# Patient Record
Sex: Male | Born: 1990 | Race: Black or African American | Hispanic: No | Marital: Single | Smoking: Never smoker
Health system: Southern US, Community
[De-identification: ages and names within clinical notes are randomized; demographics above are authoritative.]

---

## 2017-04-11 ENCOUNTER — Emergency Department
Admission: EM | Admit: 2017-04-11 | Discharge: 2017-04-11 | Disposition: A | Discharge status: Home / Self Care | Payer: Self-pay | Attending: Emergency Medicine | Admitting: Emergency Medicine

## 2017-04-11 ENCOUNTER — Encounter: Payer: Self-pay | Admitting: Emergency Medicine

## 2017-04-11 DIAGNOSIS — E876 Hypokalemia: Secondary | ICD-10-CM | POA: Insufficient documentation

## 2017-04-11 DIAGNOSIS — R197 Diarrhea, unspecified: Secondary | ICD-10-CM | POA: Insufficient documentation

## 2017-04-11 DIAGNOSIS — A071 Giardiasis [lambliasis]: Secondary | ICD-10-CM | POA: Insufficient documentation

## 2017-04-11 LAB — URINALYSIS, COMPLETE (UACMP) WITH MICROSCOPIC
BACTERIA UA: NONE SEEN
Bilirubin Urine: NEGATIVE
Glucose, UA: NEGATIVE mg/dL
Hgb urine dipstick: NEGATIVE
Ketones, ur: NEGATIVE mg/dL
Leukocytes, UA: NEGATIVE
Nitrite: NEGATIVE
Protein, ur: NEGATIVE mg/dL
SPECIFIC GRAVITY, URINE: 1.024 (ref 1.005–1.030)
SQUAMOUS EPITHELIAL / LPF: NONE SEEN
pH: 5 (ref 5.0–8.0)

## 2017-04-11 LAB — GASTROINTESTINAL PANEL BY PCR, STOOL (REPLACES STOOL CULTURE)
ASTROVIRUS: NOT DETECTED
Adenovirus F40/41: NOT DETECTED
CAMPYLOBACTER SPECIES: NOT DETECTED
Cryptosporidium: NOT DETECTED
Cyclospora cayetanensis: NOT DETECTED
ENTEROTOXIGENIC E COLI (ETEC): NOT DETECTED
Entamoeba histolytica: NOT DETECTED
Enteroaggregative E coli (EAEC): NOT DETECTED
Enteropathogenic E coli (EPEC): NOT DETECTED
Giardia lamblia: DETECTED — AB
NOROVIRUS GI/GII: NOT DETECTED
PLESIMONAS SHIGELLOIDES: NOT DETECTED
ROTAVIRUS A: NOT DETECTED
SAPOVIRUS (I, II, IV, AND V): NOT DETECTED
SHIGA LIKE TOXIN PRODUCING E COLI (STEC): NOT DETECTED
Salmonella species: NOT DETECTED
Shigella/Enteroinvasive E coli (EIEC): NOT DETECTED
VIBRIO CHOLERAE: NOT DETECTED
Vibrio species: NOT DETECTED
Yersinia enterocolitica: NOT DETECTED

## 2017-04-11 LAB — C DIFFICILE QUICK SCREEN W PCR REFLEX
C DIFFICILE (CDIFF) INTERP: NOT DETECTED
C DIFFICILE (CDIFF) TOXIN: NEGATIVE
C DIFFICLE (CDIFF) ANTIGEN: NEGATIVE

## 2017-04-11 LAB — CBC
HEMATOCRIT: 43.6 % (ref 40.0–52.0)
Hemoglobin: 14.3 g/dL (ref 13.0–18.0)
MCH: 27.3 pg (ref 26.0–34.0)
MCHC: 32.7 g/dL (ref 32.0–36.0)
MCV: 83.4 fL (ref 80.0–100.0)
Platelets: 248 10*3/uL (ref 150–440)
RBC: 5.23 MIL/uL (ref 4.40–5.90)
RDW: 13.9 % (ref 11.5–14.5)
WBC: 6.7 10*3/uL (ref 3.8–10.6)

## 2017-04-11 LAB — COMPREHENSIVE METABOLIC PANEL
ALBUMIN: 4.1 g/dL (ref 3.5–5.0)
ALT: 51 U/L (ref 17–63)
AST: 52 U/L — AB (ref 15–41)
Alkaline Phosphatase: 97 U/L (ref 38–126)
Anion gap: 10 (ref 5–15)
BILIRUBIN TOTAL: 0.8 mg/dL (ref 0.3–1.2)
BUN: 17 mg/dL (ref 6–20)
CHLORIDE: 105 mmol/L (ref 101–111)
CO2: 21 mmol/L — ABNORMAL LOW (ref 22–32)
CREATININE: 0.88 mg/dL (ref 0.61–1.24)
Calcium: 8.8 mg/dL — ABNORMAL LOW (ref 8.9–10.3)
GFR calc Af Amer: >60 mL/min (ref >60)
GFR calc non Af Amer: >60 mL/min (ref >60)
GLUCOSE: 78 mg/dL (ref 65–99)
POTASSIUM: 3.3 mmol/L — AB (ref 3.5–5.1)
Sodium: 136 mmol/L (ref 135–145)
TOTAL PROTEIN: 7.5 g/dL (ref 6.5–8.1)

## 2017-04-11 LAB — LIPASE, BLOOD: Lipase: 37 U/L (ref 11–51)

## 2017-04-11 MED ORDER — METRONIDAZOLE 500 MG PO TABS
ORAL_TABLET | ORAL | Status: AC
  Administered 2017-04-11: 500 mg via ORAL
  Filled 2017-04-11: qty 1

## 2017-04-11 MED ORDER — LOPERAMIDE HCL 2 MG PO TABS: 2.0000 mg | ORAL_TABLET | Freq: Four times a day (QID) | ORAL | 0 refills | Status: AC | PRN

## 2017-04-11 MED ORDER — POTASSIUM CHLORIDE CRYS ER 20 MEQ PO TBCR
40.0000 meq | EXTENDED_RELEASE_TABLET | Freq: Once | ORAL | Status: AC
  Administered 2017-04-11: 40 meq via ORAL
  Filled 2017-04-11: qty 2

## 2017-04-11 MED ORDER — METRONIDAZOLE 500 MG PO TABS
500.0000 mg | ORAL_TABLET | Freq: Once | ORAL | Status: AC
  Administered 2017-04-11: 500 mg via ORAL

## 2017-04-11 NOTE — ED Notes (Signed)
Patient unable to provide specimen at this time.  Patient reports he will alert RN when he feels that he is able to provide specimen

## 2017-04-11 NOTE — ED Notes (Signed)
Patient c/o loose/watery diarrhea twice/daily X 1 week. Patient denies abdominal pain, rectal pain, nausea, fevers.

## 2017-04-11 NOTE — ED Provider Notes (Addendum)
[facility] Emergency Department Provider Note  ____________________________________________  Time seen: Approximately 8:44 PM  I have reviewed the triage vital signs and the nursing notes.   HISTORY  Chief Complaint Diarrhea and Encopresis    HPI Richard Blevins is a 26 y.o. male, otherwise healthy, presenting with diarrhea for 1 week.  The patient reports that he has been experiencing painless, loose, watery, nonbloody stool twice daily for the past week.  Usually, this occurs immediately when he arrives at work and right before going to bed.  There is no associated abdominal pain, nausea or vomiting, fever or chills.  He has not been camping traveling as a denies states or had any sick contacts.  Patient had 2 episodes where he woke up and had soiled himself.   History reviewed. No pertinent past medical history.  There are no active problems to display for this patient.   History reviewed. No pertinent surgical history.    Allergies Patient has no known allergies.  No family history on file.  Social History Social History   Tobacco Use  . Smoking status: Never Smoker  . Smokeless tobacco: Never Used  Substance Use Topics  . Alcohol use: No    Frequency: Never  . Drug use: Not on file    Review of Systems Constitutional: No fever/chills.  Or syncope.  Eyes: No visual changes. ENT: No sore throat. No congestion or rhinorrhea. Cardiovascular: Denies chest pain. Denies palpitations. Respiratory: Denies shortness of breath.  No cough. Gastrointestinal: No abdominal pain.  No nausea, no vomiting.  Positive diarrhea; + encoparesis.  No constipation. Genitourinary: Negative for dysuria. Musculoskeletal: Negative for back pain. Skin: Negative for rash. Neurological: Negative for headaches. No focal numbness, tingling or weakness.     ____________________________________________   PHYSICAL EXAM:  VITAL SIGNS: ED Triage Vitals  Enc Vitals  Group     BP 04/11/17 1833 130/80     Pulse Rate 04/11/17 1833 76     Resp 04/11/17 1833 18     Temp 04/11/17 1833 98.6 F (37 C)     Temp Source 04/11/17 1833 Oral     SpO2 04/11/17 1833 100 %     Weight 04/11/17 1834 115 lb (52.2 kg)     Height 04/11/17 1834 5\' 8"  (1.727 m)     Head Circumference --      Peak Flow --      Pain Score --      Pain Loc --      Pain Edu? --      Excl. in GC? --     Constitutional: Alert and oriented. Well appearing and in no acute distress. Answers questions appropriately. Eyes: Conjunctivae are normal.  EOMI. No scleral icterus. Head: Atraumatic. Nose: No congestion/rhinnorhea. Mouth/Throat: Mucous membranes are moist.  Neck: No stridor.  Supple.   Cardiovascular: Normal rate, regular rhythm. No murmurs, rubs or gallops.  Respiratory: Normal respiratory effort.  No accessory muscle use or retractions. Lungs CTAB.  No wheezes, rales or ronchi. Gastrointestinal: Soft, nontender and nondistended.  No guarding or rebound.  No peritoneal signs. Genitourinary: No evidence of external hemorrhoids or palpable internal hemorrhoids.  The patient has normal rectal tone and brown stool. Musculoskeletal: No LE edema. Neurologic:  A&Ox3.  Speech is clear.  Face and smile are symmetric.  EOMI.  Moves all extremities well. Skin:  Skin is warm, dry and intact. No rash noted. Psychiatric: Mood and affect are normal. Speech and behavior are normal.  Normal judgement.  ____________________________________________   LABS (all labs ordered are listed, but only abnormal results are displayed)  Labs Reviewed  COMPREHENSIVE METABOLIC PANEL - Abnormal; Notable for the following components:      Result Value   Potassium 3.3 (*)    CO2 21 (*)    Calcium 8.8 (*)    AST 52 (*)    All other components within normal limits  URINALYSIS, COMPLETE (UACMP) WITH MICROSCOPIC - Abnormal; Notable for the following components:   Color, Urine YELLOW (*)    APPearance CLEAR (*)     All other components within normal limits  C DIFFICILE QUICK SCREEN W PCR REFLEX  GASTROINTESTINAL PANEL BY PCR, STOOL (REPLACES STOOL CULTURE)  LIPASE, BLOOD  CBC   ____________________________________________  EKG  Not indicated ____________________________________________  RADIOLOGY  No results found.  ____________________________________________   PROCEDURES  Procedure(s) performed: None  Procedures  Critical Care performed: No ____________________________________________   INITIAL IMPRESSION / ASSESSMENT AND PLAN / ED COURSE  Pertinent labs & imaging results that were available during my care of the patient were reviewed by me and considered in my medical decision making (see chart for details).  26 y.o. male, otherwise healthy, with 1 week of twice daily loose stool, and 2 episodes of encopresis last night.  Overall, the patient is hemodynamically stable and afebrile.  His abdominal examination is reassuring without any evidence of acute intra-abdominal surgical or infectious pathology.  His laboratory studies show some mild hypokalemia, which I will supplement.  We will get stool samples to evaluate for C. difficile or other bacterial infection.  Plan reevaluation for final disposition.  ____________________________________________  FINAL CLINICAL IMPRESSION(S) / ED DIAGNOSES  Final diagnoses:  Hypokalemia  Diarrhea, unspecified type         NEW MEDICATIONS STARTED DURING THIS VISIT:  This SmartLink is deprecated. Use AVSMEDLIST instead to display the medication list for a patient.    Rockne MenghiniNorman, Anne-Caroline, MD 04/11/17 [id number]    Rockne MenghiniNorman, Anne-Caroline, MD 04/11/17 2229

## 2017-04-11 NOTE — ED Triage Notes (Signed)
Patient presents to the ED with diarrhea x 1 week.  Patient states, "everything I eat or drink goes right through me."  Patient reports 2 episodes of bowel incontinence last night.  Patient denies history of the same.  Patient denies abdominal pain and denies vomiting.  Patient is in no obvious distress at this time.

## 2017-04-11 NOTE — Discharge Instructions (Addendum)
Please take a bland diet until your diarrhea has resolved.  Please make an appointment with a primary care physician for re-evaluation, and to have your potassium rechecked.  Return to the emergency department for severe pain, lightheadedness or fainting, fever, or any other symptoms concerning to you.  Take the flagyl 1 pill 2 x a day till gone.

## 2017-09-10 ENCOUNTER — Emergency Department
Admission: EM | Admit: 2017-09-10 | Discharge: 2017-09-10 | Disposition: A | Discharge status: Home / Self Care | Payer: Self-pay | Attending: Student in an Organized Health Care Education/Training Program | Admitting: Student in an Organized Health Care Education/Training Program

## 2017-09-10 ENCOUNTER — Other Ambulatory Visit: Payer: Self-pay

## 2017-09-10 ENCOUNTER — Encounter: Payer: Self-pay | Admitting: Emergency Medicine

## 2017-09-10 DIAGNOSIS — L84 Corns and callosities: Secondary | ICD-10-CM | POA: Insufficient documentation

## 2017-09-10 NOTE — ED Notes (Signed)
Callous under great toe right foot X 3 weeks. Aggravated with shoe on. Pt alert and oriented X4, active, cooperative, pt in NAD. RR even and unlabored, color WNL.

## 2017-09-10 NOTE — ED Notes (Signed)
Pt alert and oriented X4, active, cooperative, pt in NAD. RR even and unlabored, color WNL.  Pt informed to return if any life threatening symptoms occur.  Discharge and followup instructions reviewed. Ambulates safely. 

## 2017-09-10 NOTE — ED Triage Notes (Signed)
Pt arrives ambulatory to triage with c/o "callus on the bottom of his right foot". Pt reports pain when he walks. Pt has area to right great toes which has been irritated by his shoes. Pt is in NAD.

## 2017-09-10 NOTE — ED Provider Notes (Signed)
[facility] Emergency Department Provider Note    First MD Initiated Contact with Patient 09/10/17 [phone]     (approximate)  I have reviewed the triage vital signs and the nursing notes.   HISTORY  Chief Complaint Foot Pain    HPI Richard Blevins is a 27 y.o. male with right great toe pain and hard skin.  Patient works in [employer] is on his feet a lot.  No fevers.  No trauma.  Has been trying over-the-counter Dr. Margart SicklesScholl's callus remover without any improvement.  History reviewed. No pertinent past medical history. No family history on file. History reviewed. No pertinent surgical history. There are no active problems to display for this patient.     Prior to Admission medications   Medication Sig Start Date End Date Taking? Authorizing Provider  loperamide (IMODIUM A-D) 2 MG tablet Take 1 tablet (2 mg total) 4 (four) times daily as needed by mouth for diarrhea or loose stools. 04/11/17   Rockne MenghiniNorman, Anne-Caroline, MD    Allergies Patient has no known allergies.    Social History Social History   Tobacco Use  . Smoking status: Never Smoker  . Smokeless tobacco: Never Used  Substance Use Topics  . Alcohol use: No    Frequency: Never  . Drug use: Never    Review of Systems Patient denies headaches, rhinorrhea, blurry vision, numbness, shortness of breath, chest pain, edema, cough, abdominal pain, nausea, vomiting, diarrhea, dysuria, fevers, rashes or hallucinations unless otherwise stated above in HPI. ____________________________________________   PHYSICAL EXAM:  VITAL SIGNS: Vitals:   09/10/17 0107  BP: 120/76  Pulse: 69  Resp: 18  Temp: 98 F (36.7 C)  SpO2: 98%    Constitutional: Alert and oriented. Well appearing and in no acute distress. Eyes: Conjunctivae are normal.  Head: Atraumatic. Nose: No congestion/rhinnorhea. Mouth/Throat: Mucous membranes are moist.   Neck: Painless ROM.  Cardiovascular:   Good peripheral  circulation. Respiratory: Normal respiratory effort.  No retractions.  Gastrointestinal: Soft and nontender.  Musculoskeletal: No deformity noted patient does have a large callus to right great toe with probably some component of underlying blister but no purulence no drainage.  No joint effusions. Neurologic:  Normal speech and language. No gross focal neurologic deficits are appreciated.  Skin:  Skin is warm, dry and intact. No rash noted. Psychiatric: Mood and affect are normal. Speech and behavior are normal.  ____________________________________________   LABS (all labs ordered are listed, but only abnormal results are displayed)  No results found for this or any previous visit (from the past 24 hour(s)). ____________________________________________ ____________________________________________  RADIOLOGY   ____________________________________________   PROCEDURES  Procedure(s) performed:  Procedures    Critical Care performed: no ____________________________________________   INITIAL IMPRESSION / ASSESSMENT AND PLAN / ED COURSE  Pertinent labs & imaging results that were available during my care of the patient were reviewed by me and considered in my medical decision making (see chart for details).  DDX: Callus, blister, boil, fracture  Richard Blevins is a 27 y.o. who presents to the ED with symptoms as described above.  Patient in no acute distress.  No evidence of fracture.  Pain likely coming from callus.  Explained conservative management and signs and symptoms for which he should seek medical care.  Will give referral to podiatry.      ____________________________________________   FINAL CLINICAL IMPRESSION(S) / ED DIAGNOSES  Final diagnoses:  Callus of foot      NEW MEDICATIONS STARTED DURING THIS  VISIT:  New Prescriptions   No medications on file     Note:  This document was prepared using Dragon voice recognition software and may include  unintentional dictation errors.     Willy Eddy, MD 09/10/17 [phone]

## 2019-08-05 ENCOUNTER — Emergency Department (HOSPITAL_COMMUNITY)
Admission: EM | Admit: 2019-08-05 | Discharge: 2019-08-05 | Disposition: A | Discharge status: Home / Self Care | Payer: Self-pay | Attending: Emergency Medicine | Admitting: Emergency Medicine

## 2019-08-05 ENCOUNTER — Emergency Department (HOSPITAL_COMMUNITY): Payer: Self-pay

## 2019-08-05 ENCOUNTER — Other Ambulatory Visit: Payer: Self-pay

## 2019-08-05 ENCOUNTER — Encounter (HOSPITAL_COMMUNITY): Payer: Self-pay

## 2019-08-05 DIAGNOSIS — N451 Epididymitis: Secondary | ICD-10-CM

## 2019-08-05 DIAGNOSIS — N50819 Testicular pain, unspecified: Secondary | ICD-10-CM

## 2019-08-05 DIAGNOSIS — Z79899 Other long term (current) drug therapy: Secondary | ICD-10-CM | POA: Insufficient documentation

## 2019-08-05 LAB — URINALYSIS, ROUTINE W REFLEX MICROSCOPIC
Bacteria, UA: NONE SEEN
Bilirubin Urine: NEGATIVE
Glucose, UA: NEGATIVE mg/dL
Ketones, ur: NEGATIVE mg/dL
Leukocytes,Ua: NEGATIVE
Nitrite: NEGATIVE
Protein, ur: NEGATIVE mg/dL
Specific Gravity, Urine: 1.025 (ref 1.005–1.030)
pH: 6 (ref 5.0–8.0)

## 2019-08-05 LAB — HIV ANTIBODY (ROUTINE TESTING W REFLEX): HIV Screen 4th Generation wRfx: NONREACTIVE

## 2019-08-05 MED ORDER — DOXYCYCLINE HYCLATE 100 MG PO CAPS: 100.0000 mg | ORAL_CAPSULE | Freq: Two times a day (BID) | ORAL | 0 refills | Status: DC

## 2019-08-05 MED ORDER — DOXYCYCLINE HYCLATE 100 MG PO TABS
100.0000 mg | ORAL_TABLET | Freq: Once | ORAL | Status: AC
  Administered 2019-08-05: 100 mg via ORAL
  Filled 2019-08-05: qty 1

## 2019-08-05 NOTE — Discharge Instructions (Addendum)
Apply ice as needed.  Use a jock strap as needed.  Take ibuprofen or naproxen as needed for pain.  You may add acetaminophen as needed for additional pain relief.

## 2019-08-05 NOTE — ED Provider Notes (Signed)
[place] [facility] DEPT Provider Note   CSN: [id number] Arrival date & time: 08/05/19  0139   History Chief Complaint  Patient presents with  . Testicle Pain    Richard Blevins is a 29 y.o. male.  The history is provided by the patient.  Testicle Pain  He complains of pain and swelling in the left testicle which started this morning.  Pain is constant and he rates it at 3/10.  It is worse with palpation or movement.  He denies fever or chills.  He denies dysuria.  He denies urethral discharge.  He denies any unprotected sex.  History reviewed. No pertinent past medical history.  There are no problems to display for this patient.   History reviewed. No pertinent surgical history.     History reviewed. No pertinent family history.  Social History   Tobacco Use  . Smoking status: Never Smoker  . Smokeless tobacco: Never Used  Substance Use Topics  . Alcohol use: No  . Drug use: Never    Home Medications Prior to Admission medications   Medication Sig Start Date End Date Taking? Authorizing Provider  loperamide (IMODIUM A-D) 2 MG tablet Take 1 tablet (2 mg total) 4 (four) times daily as needed by mouth for diarrhea or loose stools. 04/11/17   Eula Listen, MD    Allergies    Patient has no known allergies.  Review of Systems   Review of Systems  Genitourinary: Positive for testicular pain.  All other systems reviewed and are negative.   Physical Exam Updated Vital Signs BP 111/70 (BP Location: Left Arm)   Pulse 71   Temp 98.8 F (37.1 C) (Oral)   Resp 16   SpO2 96%   Physical Exam Vitals and nursing note reviewed.   29 year old male, resting comfortably and in no acute distress. Vital signs are normal. Oxygen saturation is 96%, which is normal. Head is normocephalic and atraumatic. PERRLA, EOMI. Oropharynx is clear. Neck is nontender and supple without adenopathy or JVD. Back is nontender and there is no CVA  tenderness. Lungs are clear without rales, wheezes, or rhonchi. Chest is nontender. Heart has regular rate and rhythm without murmur. Abdomen is soft, flat, nontender without masses or hepatosplenomegaly and peristalsis is normoactive. Genitalia: Circumcised penis.  Testes descended bilaterally.  Moderate swelling and tenderness and mild induration of the epididymis of the left testicle.  Shotty inguinal adenopathy present bilaterally. Extremities have no cyanosis or edema, full range of motion is present. Skin is warm and dry without rash. Neurologic: Mental status is normal, cranial nerves are intact, there are no motor or sensory deficits.  ED Results / Procedures / Treatments   Labs (all labs ordered are listed, but only abnormal results are displayed) Labs Reviewed  URINALYSIS, ROUTINE W REFLEX MICROSCOPIC - Abnormal; Notable for the following components:      Result Value   Hgb urine dipstick MODERATE (*)    All other components within normal limits  HIV ANTIBODY (ROUTINE TESTING W REFLEX)  GC/CHLAMYDIA PROBE AMP ([place]) NOT AT [facility]   Radiology US SCROTUM W/DOPPLER  Result Date: 08/05/2019 CLINICAL DATA:  Left testicular pain EXAM: SCROTAL ULTRASOUND DOPPLER ULTRASOUND OF THE TESTICLES TECHNIQUE: Complete ultrasound examination of the testicles, epididymis, and other scrotal structures was performed. Color and spectral Doppler ultrasound were also utilized to evaluate blood flow to the testicles. COMPARISON:  None. FINDINGS: Right testicle Measurements: 4.5 x 2 point by 3.3 cm. No mass or microlithiasis visualized. Left  testicle Measurements: 4.9 x 3.4 x 3 cm. There is increased vascularity within the left testicle. Right epididymis:  Normal in size and appearance. Left epididymis: The left epididymis is slightly enlarged and hypervascular. Hydrocele:  A left-sided hydrocele is noted. Varicocele:  There is a left-sided varicocele. Pulsed Doppler interrogation of both testes  demonstrates normal low resistance arterial and venous waveforms bilaterally. IMPRESSION: 1. No evidence for testicular torsion. 2. Findings most consistent with left-sided epididymo-orchitis. 3. Small left-sided hydrocele. 4. A left-sided varicoceles present. Electronically Signed   By: Katherine Mantle M.D.   On: 08/05/2019 03:06    Procedures Procedures   Medications Ordered in ED Medications  doxycycline (VIBRA-TABS) tablet 100 mg (has no administration in time range)    ED Course  I have reviewed the triage vital signs and the nursing notes.  Pertinent labs & imaging results that were available during my care of the patient were reviewed by me and considered in my medical decision making (see chart for details).  MDM Rules/Calculators/A&P Left testicular pain and swelling most likely epididymitis.  Will get ultrasound with Doppler to rule out testicular torsion.  Will also screen for sexually transmitted infections.  Old records are reviewed, and he has no relevant past visits.  Urinalysis shows no WBCs.  Ultrasound is consistent with left epididymitis/orchitis.  He is given a prescription for doxycycline.  Work release given for 48 hours.  Final Clinical Impression(s) / ED Diagnoses Final diagnoses:  Testicle pain  Epididymitis    Rx / DC Orders ED Discharge Orders         Ordered    doxycycline (VIBRAMYCIN) 100 MG capsule  2 times daily     08/05/19 4818           Dione Booze, MD 08/05/19 [phone]

## 2019-08-05 NOTE — ED Triage Notes (Addendum)
Pt reports a headache and R sided testicle pain yesterday. States that the pain has decreased, but his R testicle is significantly swollen.

## 2019-08-05 NOTE — ED Notes (Signed)
Labeled urine specimen and culture sent to lab. ENMiles 

## 2020-06-05 ENCOUNTER — Other Ambulatory Visit: Payer: Self-pay

## 2020-06-05 ENCOUNTER — Emergency Department (HOSPITAL_COMMUNITY)
Admission: EM | Admit: 2020-06-05 | Discharge: 2020-06-05 | Disposition: A | Discharge status: Home / Self Care | Payer: Self-pay | Attending: Emergency Medicine | Admitting: Emergency Medicine

## 2020-06-05 DIAGNOSIS — N342 Other urethritis: Secondary | ICD-10-CM | POA: Insufficient documentation

## 2020-06-05 LAB — URINALYSIS, ROUTINE W REFLEX MICROSCOPIC
Bilirubin Urine: NEGATIVE
Glucose, UA: NEGATIVE mg/dL
Ketones, ur: NEGATIVE mg/dL
Nitrite: NEGATIVE
Protein, ur: NEGATIVE mg/dL
RBC / HPF: >50 RBC/hpf — ABNORMAL HIGH (ref 0–5)
Specific Gravity, Urine: 1.021 (ref 1.005–1.030)
WBC, UA: >50 WBC/hpf — ABNORMAL HIGH (ref 0–5)
pH: 7 (ref 5.0–8.0)

## 2020-06-05 MED ORDER — STERILE WATER FOR INJECTION IJ SOLN
INTRAMUSCULAR | Status: AC
  Filled 2020-06-05: qty 10

## 2020-06-05 MED ORDER — CEFTRIAXONE SODIUM 1 G IJ SOLR
500.0000 mg | Freq: Once | INTRAMUSCULAR | Status: AC
  Administered 2020-06-05: 500 mg via INTRAMUSCULAR
  Filled 2020-06-05: qty 10

## 2020-06-05 MED ORDER — PHENAZOPYRIDINE HCL 200 MG PO TABS
200.0000 mg | ORAL_TABLET | Freq: Once | ORAL | Status: AC
  Administered 2020-06-05: 200 mg via ORAL
  Filled 2020-06-05: qty 1

## 2020-06-05 MED ORDER — DOXYCYCLINE HYCLATE 100 MG PO CAPS: 100.0000 mg | ORAL_CAPSULE | Freq: Two times a day (BID) | ORAL | 0 refills | Status: AC

## 2020-06-05 NOTE — ED Provider Notes (Signed)
[facility]-EMERGENCY DEPT Provider Note   CSN: [id number] Arrival date & time: 06/05/20  1731     History Chief Complaint  Patient presents with  . Dysuria    Richard Blevins is a 30 y.o. male.  The history is provided by the patient.  Dysuria Presenting symptoms: dysuria   Presenting symptoms: no penile discharge, no penile pain and no scrotal pain   Context: after urination and during urination   Relieved by:  None tried Worsened by:  Urination Ineffective treatments:  None tried Associated symptoms: urinary frequency   Associated symptoms: no abdominal pain, no diarrhea, no fever, no flank pain, no genital lesions, no genital rash, no groin pain, no nausea, no penile redness, no penile swelling, no scrotal swelling, no urinary retention and no vomiting   Associated symptoms comment:  Sexually active with men.  Has only 1 partner and does not use protection Risk factors: unprotected sex   Risk factors: no HIV        No past medical history on file.  There are no problems to display for this patient.   No past surgical history on file.     No family history on file.  Social History   Tobacco Use  . Smoking status: Never Smoker  . Smokeless tobacco: Never Used  Substance Use Topics  . Alcohol use: No  . Drug use: Never    Home Medications Prior to Admission medications   Medication Sig Start Date End Date Taking? Authorizing Provider  doxycycline (VIBRAMYCIN) 100 MG capsule Take 1 capsule (100 mg total) by mouth 2 (two) times daily. One po bid x 7 days 08/05/19   Dione Booze, MD  loperamide (IMODIUM A-D) 2 MG tablet Take 1 tablet (2 mg total) 4 (four) times daily as needed by mouth for diarrhea or loose stools. 04/11/17   Rockne Menghini, MD    Allergies    Patient has no known allergies.  Review of Systems   Review of Systems  Constitutional: Negative for fever.  Gastrointestinal: Negative for abdominal pain, diarrhea, nausea  and vomiting.  Genitourinary: Positive for dysuria and frequency. Negative for flank pain, penile discharge, penile pain, penile swelling and scrotal swelling.  All other systems reviewed and are negative.   Physical Exam Updated Vital Signs BP 128/78 (BP Location: Left Arm)   Pulse 64   Temp 98.5 F (36.9 C) (Oral)   Resp 16   Ht 5\' 8"  (1.727 m)   Wt 52.2 kg   SpO2 97%   BMI 17.49 kg/m   Physical Exam Vitals and nursing note reviewed.  Constitutional:      General: He is not in acute distress.    Appearance: He is well-developed and well-nourished.  HENT:     Head: Normocephalic and atraumatic.     Mouth/Throat:     Mouth: Oropharynx is clear and moist.  Eyes:     Extraocular Movements: EOM normal.     Conjunctiva/sclera: Conjunctivae normal.     Pupils: Pupils are equal, round, and reactive to light.  Cardiovascular:     Rate and Rhythm: Normal rate and regular rhythm.     Pulses: Intact distal pulses.     Heart sounds: No murmur heard.   Pulmonary:     Effort: Pulmonary effort is normal. No respiratory distress.     Breath sounds: Normal breath sounds. No wheezing or rales.  Abdominal:     General: There is no distension.     Palpations: Abdomen  is soft.     Tenderness: There is no abdominal tenderness. There is no right CVA tenderness, left CVA tenderness, guarding or rebound.  Musculoskeletal:        General: No tenderness or edema. Normal range of motion.     Cervical back: Normal range of motion and neck supple.  Skin:    General: Skin is warm and dry.     Findings: No erythema or rash.  Neurological:     Mental Status: He is alert and oriented to person, place, and time.  Psychiatric:        Mood and Affect: Mood and affect normal.        Behavior: Behavior normal.      ED Results / Procedures / Treatments   Labs (all labs ordered are listed, but only abnormal results are displayed) Labs Reviewed  URINALYSIS, ROUTINE W REFLEX MICROSCOPIC -  Abnormal; Notable for the following components:      Result Value   APPearance HAZY (*)    Hgb urine dipstick SMALL (*)    Leukocytes,Ua SMALL (*)    RBC / HPF >50 (*)    WBC, UA >50 (*)    Bacteria, UA RARE (*)    All other components within normal limits  URINE CULTURE  RPR  HIV ANTIBODY (ROUTINE TESTING W REFLEX)  GC/CHLAMYDIA PROBE AMP ([place]) NOT AT [facility]    EKG None  Radiology No results found.  Procedures Procedures (including critical care time)  Medications Ordered in ED Medications  phenazopyridine (PYRIDIUM) tablet 200 mg (has no administration in time range)    ED Course  I have reviewed the triage vital signs and the nursing notes.  Pertinent labs & imaging results that were available during my care of the patient were reviewed by me and considered in my medical decision making (see chart for details).    MDM Rules/Calculators/A&P                          Patient presenting today with a complaint of dysuria. Symptoms started last night and have persisted throughout the day. It is only present with urination. He denies any abdominal pain, back pain or fevers. He is well-appearing on exam. He is currently sexually active with only one partner but does not use protection. Partner is asymptomatic at this time. He denies any genital or scrotal lesions. With infection He will be tested for HIV and syphilis. Also GC and chlamydia sent on urine. Urine is consistent with small leukocytes and greater than 50 white and red blood cells with rare bacteria.  Pt given pyridium will treat with abx.   MDM Number of Diagnoses or Management Options   Amount and/or Complexity of Data Reviewed Clinical lab tests: ordered and reviewed Independent visualization of images, tracings, or specimens: yes   Final Clinical Impression(s) / ED Diagnoses Final diagnoses:  Urethritis    Rx / DC Orders ED Discharge Orders         Ordered    doxycycline (VIBRAMYCIN) 100 MG  capsule  2 times daily        06/05/20 2036           Gwyneth Sprout, MD 06/05/20 2038

## 2020-06-05 NOTE — ED Triage Notes (Signed)
Pt POV with c/o dysuria, starting last night.  Pt reports increased urinary frequency and noted some blood at meatus.  Denies malodorous urine. Denies difficulty with urination.

## 2020-06-05 NOTE — Discharge Instructions (Signed)
The blood results and cultures will return on your my chart account in the next 1-2 days.

## 2020-06-06 LAB — HIV ANTIBODY (ROUTINE TESTING W REFLEX): HIV Screen 4th Generation wRfx: NONREACTIVE

## 2020-06-06 LAB — RPR: RPR Ser Ql: NONREACTIVE

## 2020-06-07 LAB — URINE CULTURE: Culture: >=100000 — AB

## 2020-06-08 LAB — URINE CULTURE

## 2020-06-09 ENCOUNTER — Telehealth: Payer: Self-pay | Admitting: Emergency Medicine

## 2020-06-09 LAB — GC/CHLAMYDIA PROBE AMP (~~LOC~~) NOT AT ARMC
Chlamydia: NEGATIVE
Comment: NEGATIVE
Comment: NORMAL
Neisseria Gonorrhea: NEGATIVE

## 2020-06-09 NOTE — Telephone Encounter (Signed)
Post ED Visit - Positive Culture Follow-up: Successful Patient Follow-Up  Culture assessed and recommendations reviewed by:  []  , Pharm.D. []  Enzo Bi, .D., BCPS AQ-ID []  Celedonio Miyamoto, Pharm.D., BCPS []  [address], Pharm.D., BCPS []  [place], Garvin Fila.D., BCPS, AAHIVP []  , Pharm.D., BCPS, AAHIVP []  Georgina Pillion, PharmD, BCPS []  , PharmD, BCPS []  [place], PharmD, BCPS []  [address], PharmD PharmD  Positive urine culture  [x]  Patient discharged without antimicrobial prescription and treatment is now indicated []  Organism is resistant to prescribed ED discharge antimicrobial []  Patient with positive blood cultures  Changes discussed with ED provider: Estella Husk PA New antibiotic prescription continue doxycycline, start cephalexin 500mg  po qid x 7 days  Called to [facility] [phone]  Contacted patient,    06/09/2020, 1:33 PM

## 2020-06-09 NOTE — Progress Notes (Signed)
ED Antimicrobial Stewardship Positive Culture Follow Up   Richard Blevins is an 30 y.o. male who presented to [facility] on 06/05/2020 with a chief complaint of  Chief Complaint  Patient presents with  . Dysuria    Recent Results (from the past 720 hour(s))  Urine C&S     Status: Abnormal   Collection Time: 06/05/20  6:24 PM   Specimen: Urine, Random  Result Value Ref Range Status   Specimen Description   Final    URINE, RANDOM Performed at [facility], [address], [place], [place] [address]    Special Requests   Final    NONE Performed at [facility], [address] [place], [place] [address]    Culture >=100,000 COLONIES/mL ESCHERICHIA COLI (A)  Final   Report Status 06/08/2020 FINAL  Final   Organism ID, Bacteria ESCHERICHIA COLI (A)  Final      Susceptibility   Escherichia coli - MIC*    AMPICILLIN 8 SENSITIVE Sensitive     CEFAZOLIN <=4 SENSITIVE Sensitive     CEFEPIME <=0.12 SENSITIVE Sensitive     CEFTRIAXONE <=0.25 SENSITIVE Sensitive     CIPROFLOXACIN <=0.25 SENSITIVE Sensitive     GENTAMICIN <=1 SENSITIVE Sensitive     IMIPENEM <=0.25 SENSITIVE Sensitive     NITROFURANTOIN <=16 SENSITIVE Sensitive     TRIMETH/SULFA <=20 SENSITIVE Sensitive     AMPICILLIN/SULBACTAM 4 SENSITIVE Sensitive     PIP/TAZO <=4 SENSITIVE Sensitive     * >=100,000 COLONIES/mL ESCHERICHIA COLI    Given doxycycline for potential chlamydia. Needs additional therapy for E Coli in urine.   New antibiotic prescription: Cephalexin 500 mg QID X 7 days   ED Provider: Harolyn Rutherford, PA-C    Sharin Mons, PharmD, BCPS, BCIDP Infectious Diseases Clinical Pharmacist Phone: [phone] 06/09/2020, 9:03 AM

## 2021-07-19 IMAGING — US US SCROTUM W/ DOPPLER COMPLETE
1 series · 14 of 25 positions shown · non-contrast
Comparison: None.

CLINICAL DATA: Left testicular pain

EXAM:
SCROTAL ULTRASOUND
DOPPLER ULTRASOUND OF THE TESTICLES
TECHNIQUE: Complete ultrasound examination of the testicles, epididymis, and
other scrotal structures was performed. Color and spectral Doppler
ultrasound were also utilized to evaluate blood flow to the
testicles.

[Series 1: us scrotum w/ doppler complete · 14 of 73 slices shown]
[im 1/73]
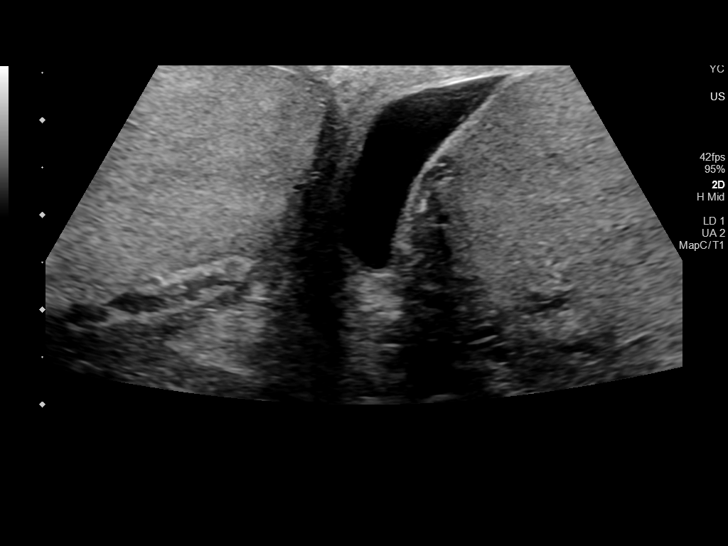
[im 7/73]
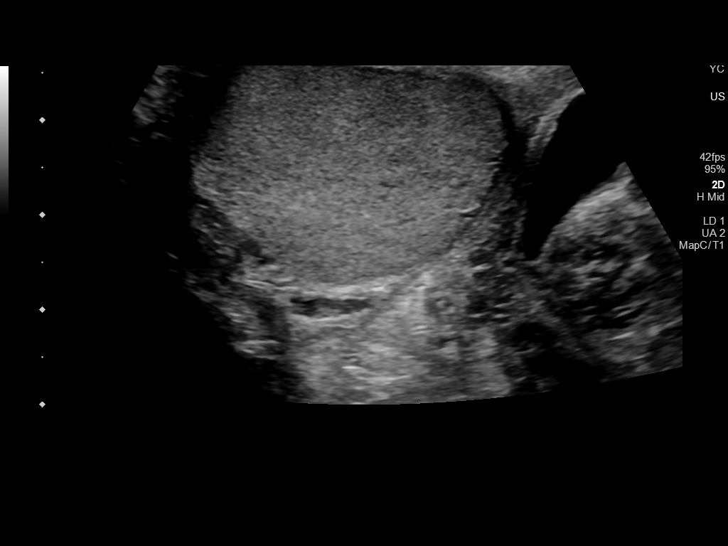
[im 13/73]
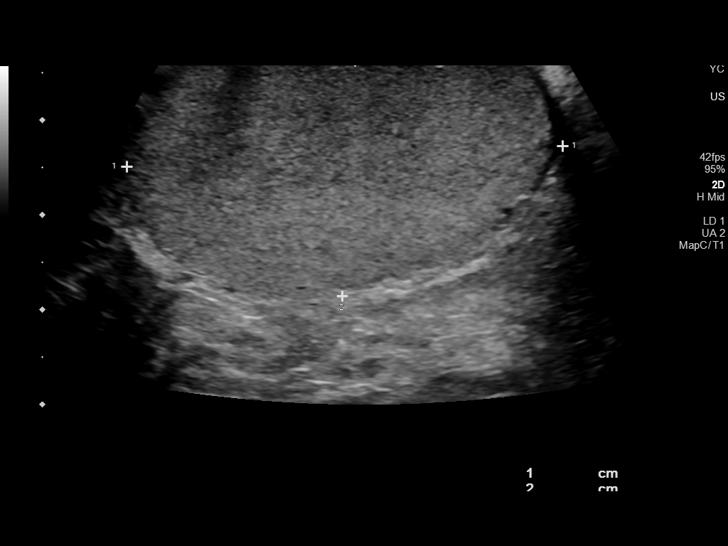
[im 19/73]
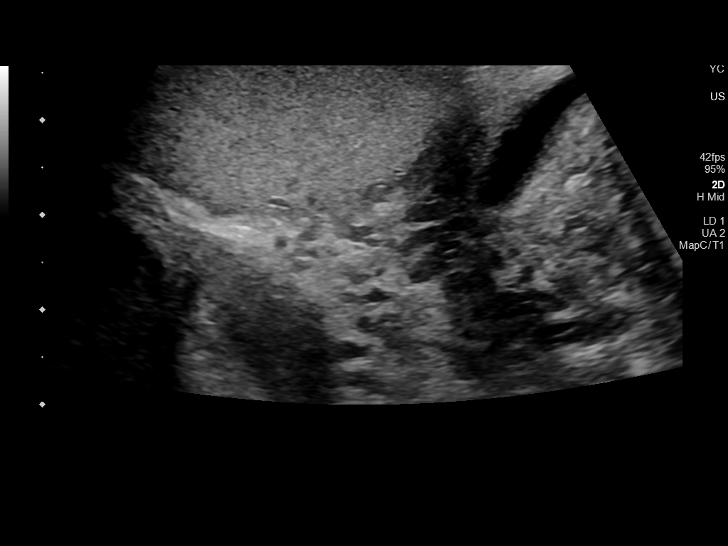
[im 25/73]
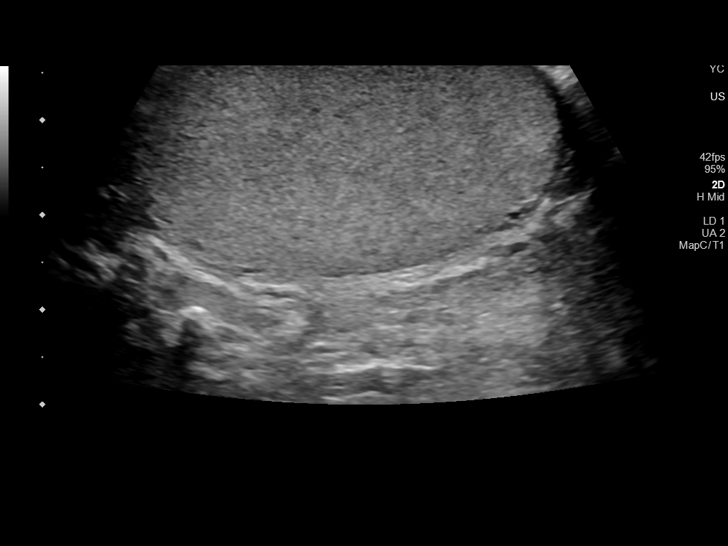
[im 28/73]
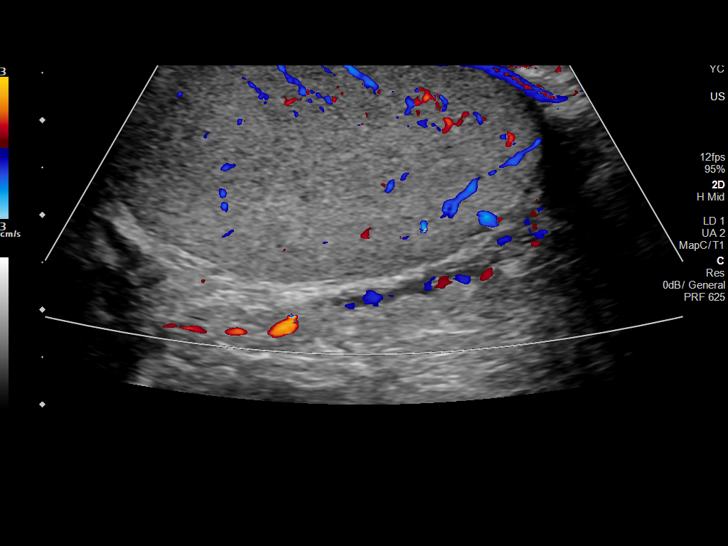
[im 34/73]
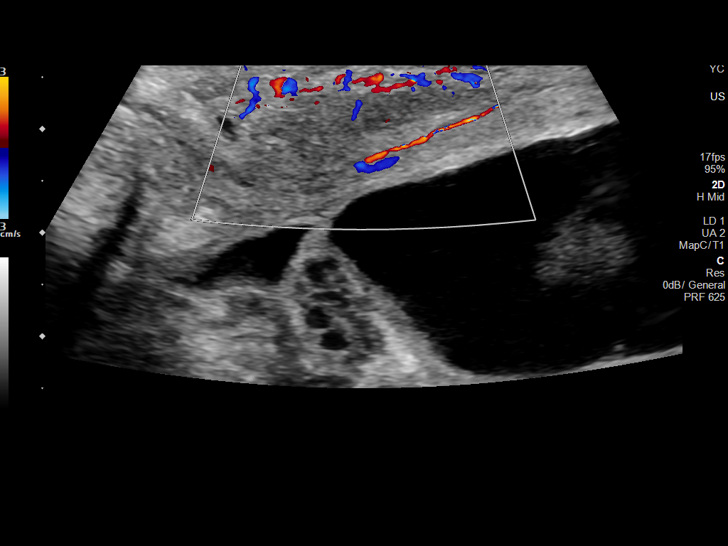
[im 40/73]
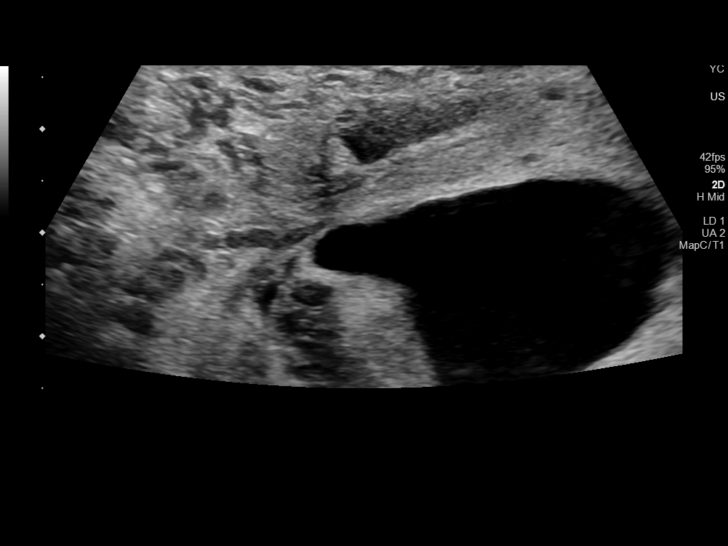
[im 46/73]
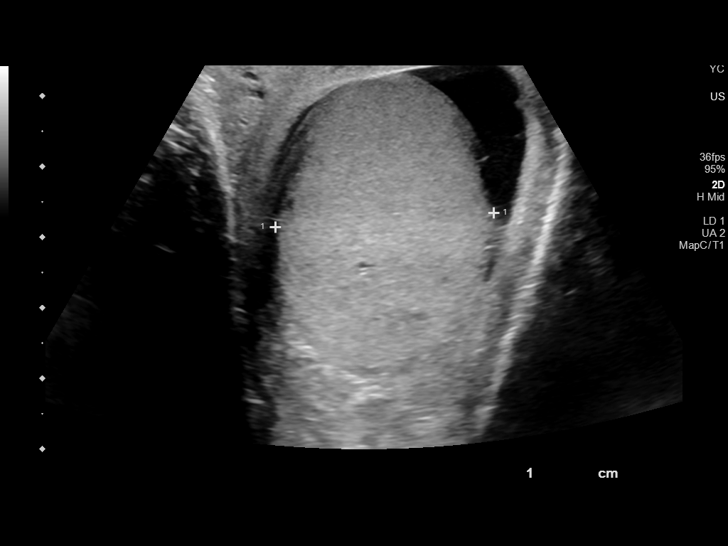
[im 49/73]
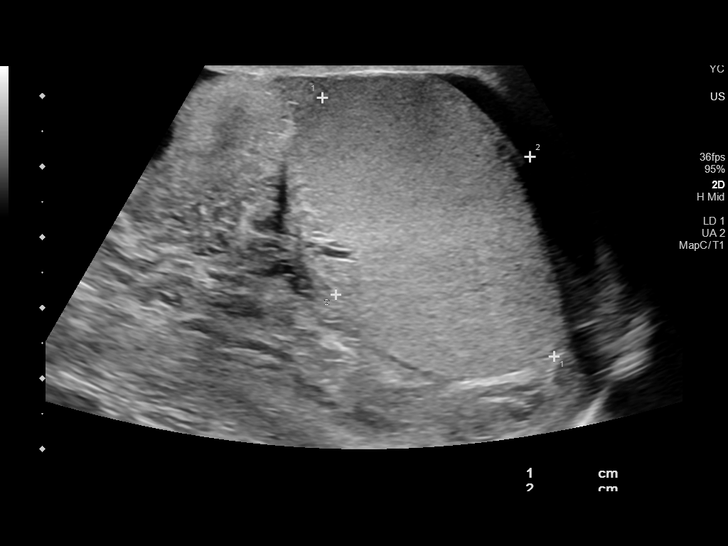
[im 55/73]
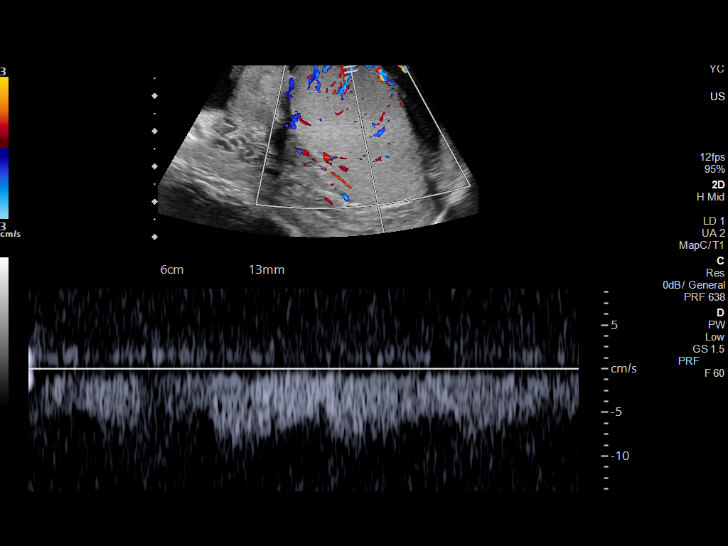
[im 61/73]
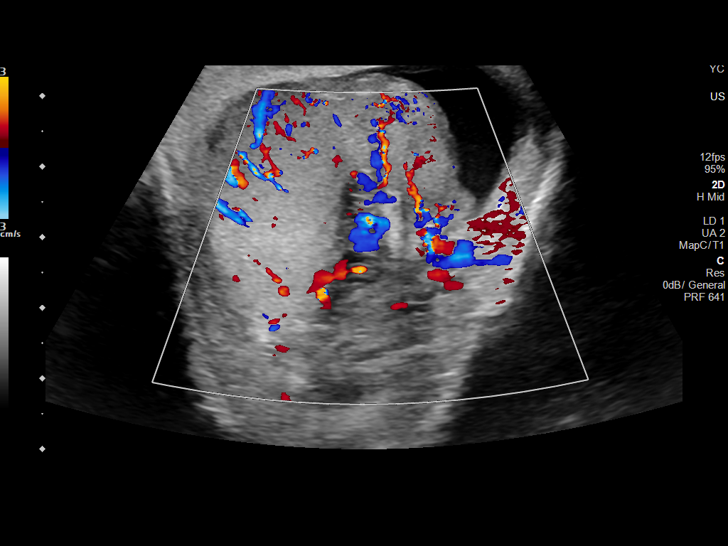
[im 67/73]
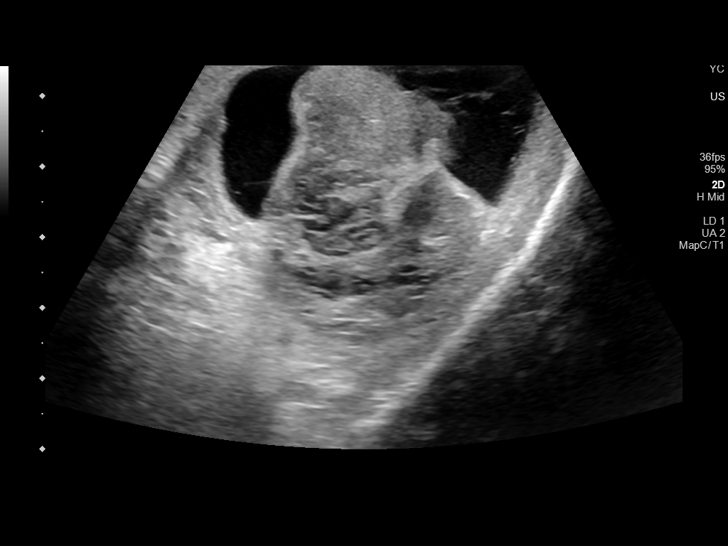
[im 73/73]
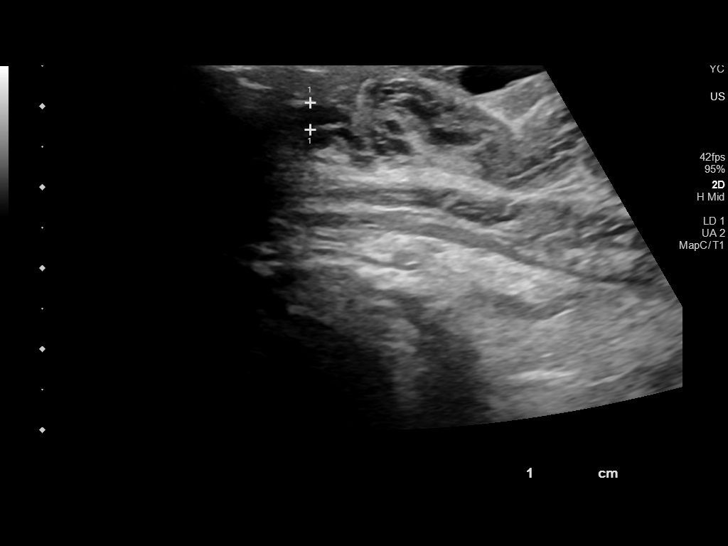

[14 of 25 positions shown; findings below may reference images not displayed]

FINDINGS: Right testicle

Measurements: 4.5 x 2 point by 3.3 cm. No mass or microlithiasis
visualized.

Left testicle

Measurements: 4.9 x 3.4 x 3 cm. There is increased vascularity
within the left testicle.

Right epididymis:  Normal in size and appearance.

Left epididymis: The left epididymis is slightly enlarged and
hypervascular.

Hydrocele:  A left-sided hydrocele is noted.

Varicocele:  There is a left-sided varicocele.

Pulsed Doppler interrogation of both testes demonstrates normal low
resistance arterial and venous waveforms bilaterally.
IMPRESSION: 1. No evidence for testicular torsion.
2. Findings most consistent with left-sided epididymo-orchitis.
3. Small left-sided hydrocele.
4. A left-sided varicoceles present.

## 2023-09-12 ENCOUNTER — Ambulatory Visit
Admission: EM | Admit: 2023-09-12 | Discharge: 2023-09-12 | Disposition: A | Discharge status: Home / Self Care | Attending: Physician Assistant | Admitting: Physician Assistant

## 2023-09-12 ENCOUNTER — Other Ambulatory Visit: Payer: Self-pay

## 2023-09-12 DIAGNOSIS — M79662 Pain in left lower leg: Secondary | ICD-10-CM | POA: Diagnosis not present

## 2023-09-12 DIAGNOSIS — M79661 Pain in right lower leg: Secondary | ICD-10-CM

## 2023-09-12 DIAGNOSIS — M79605 Pain in left leg: Secondary | ICD-10-CM | POA: Diagnosis not present

## 2023-09-12 DIAGNOSIS — M79604 Pain in right leg: Secondary | ICD-10-CM

## 2023-09-12 MED ORDER — ACETAMINOPHEN 325 MG PO TABS
650.0000 mg | ORAL_TABLET | Freq: Once | ORAL | Status: AC
  Administered 2023-09-12: 650 mg via ORAL

## 2023-09-12 NOTE — ED Triage Notes (Addendum)
 Pt presents with complaints of bilateral leg tightness x 2 days. Denies recent injury to lower extremity. Pt currently rates his overall pain a 2/10, pain increases with ambulation/movement. OTC Icy Hot "roller" applied to bilateral legs with no relief.

## 2023-09-12 NOTE — ED Provider Notes (Signed)
 [facility]    CSN: [id number] Arrival date & time: 09/12/23  1912      History   Chief Complaint Chief Complaint  Patient presents with   Leg Pain    HPI Jonathandavid Marlett is a 33 y.o. male.   HPI  He reports pain and tightness in bilateral calves for the past 2 days  He also reports soreness along proximal aspect of calves   He states the pain is not very noticeable while resting and sitting but is more prevalent while ambulating and when getting into standing position He reports the pain lasts the entire time he is upright  He denies swelling, redness, irritation to legs that he has noticed He denies weakness or numbness in legs   He reports that he works in [employer] so this is impacting daily activities  Interventions: Icyhot roll on and icyhot patch but this did not provide relief Patient's companion reports that he did have diarrhea about a week before his leg pain started and has been trying to rehydrate with electrolyte replacement beverages.   History reviewed. No pertinent past medical history.  There are no active problems to display for this patient.   History reviewed. No pertinent surgical history.     Home Medications    Prior to Admission medications   Medication Sig Start Date End Date Taking? Authorizing Provider  doxycycline (VIBRAMYCIN) 100 MG capsule Take 1 capsule (100 mg total) by mouth 2 (two) times daily. 06/05/20   Gwyneth Sprout, MD  loperamide (IMODIUM A-D) 2 MG tablet Take 1 tablet (2 mg total) 4 (four) times daily as needed by mouth for diarrhea or loose stools. 04/11/17   Rockne Menghini, MD    Family History History reviewed. No pertinent family history.  Social History Social History   Tobacco Use   Smoking status: Never   Smokeless tobacco: Never  Vaping Use   Vaping status: Never Used  Substance Use Topics   Alcohol use: No   Drug use: Never     Allergies   Patient has no known  allergies.   Review of Systems Review of Systems  Musculoskeletal:  Positive for myalgias. Negative for arthralgias, gait problem and joint swelling.     Physical Exam Triage Vital Signs ED Triage Vitals  Encounter Vitals Group     BP 09/12/23 1923 105/68     Systolic BP Percentile --      Diastolic BP Percentile --      Pulse Rate 09/12/23 1923 76     Resp 09/12/23 1923 18     Temp 09/12/23 1923 98.4 F (36.9 C)     Temp Source 09/12/23 1923 Oral     SpO2 09/12/23 1923 98 %     Weight 09/12/23 1926 122 lb (55.3 kg)     Height 09/12/23 1926 5\' 8"  (1.727 m)     Head Circumference --      Peak Flow --      Pain Score 09/12/23 1926 2     Pain Loc --      Pain Education --      Exclude from Growth Chart --    No data found.  Updated Vital Signs BP 105/68 (BP Location: Right Arm)   Pulse 76   Temp 98.4 F (36.9 C) (Oral)   Resp 18   Ht 5\' 8"  (1.727 m)   Wt 122 lb (55.3 kg)   SpO2 98%   BMI 18.55 kg/m   Visual Acuity  Right Eye Distance:   Left Eye Distance:   Bilateral Distance:    Right Eye Near:   Left Eye Near:    Bilateral Near:     Physical Exam Vitals reviewed.  Constitutional:      General: He is awake.     Appearance: Normal appearance. He is well-developed and well-groomed.  HENT:     Head: Normocephalic and atraumatic.  Musculoskeletal:     Right knee: Normal. Normal range of motion.     Left knee: Normal. Normal range of motion.     Right lower leg: Tenderness present. No swelling or bony tenderness. No edema.     Left lower leg: Tenderness present. No swelling or bony tenderness. No edema.     Right ankle: Normal range of motion. Normal pulse.     Right Achilles Tendon: No tenderness. Thompson's test negative.     Left ankle: Normal range of motion. Normal pulse.     Left Achilles Tendon: No tenderness. Thompson's test negative.     Comments: Patient has exquisite tenderness along the posterior calves bilaterally.  Negative Thompson's test  but he does report extreme tenderness with palpation.  No signs of bruising, swelling, erythema, skin breakdown or rash.  Posterior tibialis pulses are 2+ bilaterally and brisk.  Neurological:     Mental Status: He is alert.  Psychiatric:        Attention and Perception: Attention and perception normal.        Mood and Affect: Mood and affect normal.        Speech: Speech normal.        Behavior: Behavior normal. Behavior is cooperative.      UC Treatments / Results  Labs (all labs ordered are listed, but only abnormal results are displayed) Labs Reviewed  CBC WITH DIFFERENTIAL/PLATELET  COMPREHENSIVE METABOLIC PANEL WITH GFR  VITAMIN B12    EKG   Radiology No results found.  Procedures Procedures (including critical care time)  Medications Ordered in UC Medications - No data to display  Initial Impression / Assessment and Plan / UC Course  I have reviewed the triage vital signs and the nursing notes.  Pertinent labs & imaging results that were available during my care of the patient were reviewed by me and considered in my medical decision making (see chart for details).      Final Clinical Impressions(s) / UC Diagnoses   Final diagnoses:  Leg pain, bilateral  Bilateral calf pain   Patient was seen today for concerns for Bilateral calf pain.  He denies recent changes to activity level, job, shoes, exercise intensity.  Physical exam is overall reassuring in regards to range of motion and physiology.  No obvious signs of swelling, bruising, rash, physical deformity.  Given patient's recent illness I am most suspicious for potential electrolyte derangement and muscle cramping.  Will get CMP, CBC, B12 for further evaluation.  Recommend staying well-hydrated and using electrolyte replacement beverages as needed.  Reviewed that I recommend drinking 1 electrolyte beverage and then at least 2 bottles of water to prevent over replenishment.  For now recommend alternating  Tylenol and ibuprofen as needed for pain, warm compresses, gentle massage and stretches as tolerated.  ED return precautions reviewed and provided in after visit summary.  Follow-up as needed.    Discharge Instructions      You were seen today for pain in the calves.  At this time I do not see any obvious signs of bruising, rash, swelling or  redness.  I am less suspicious for a blood clot or injury at this time.  I am more inclined to suspect this may be muscle cramping due to your recent illness.  We are running blood work to check for this and we will keep you updated on those results once they are available.  If we need to provide any medications or if I recommend any supplements they will be noted in your result notes and phone call.  For now I recommend making sure that you stay well-hydrated, gentle stretches and massage to the area as well as alternating Tylenol and ibuprofen as needed for pain. If your pain becomes more severe, you start having difficulty walking, numbness, tingling, weakness in the legs please go to the emergency room as these could be signs of medical emergency.     ED Prescriptions   None    PDMP not reviewed this encounter.   Maddalyn Lutze E, PA-C 09/12/23 2003

## 2023-09-12 NOTE — Discharge Instructions (Addendum)
 You were seen today for pain in the calves.  At this time I do not see any obvious signs of bruising, rash, swelling or redness.  I am less suspicious for a blood clot or injury at this time.  I am more inclined to suspect this may be muscle cramping due to your recent illness.  We are running blood work to check for this and we will keep you updated on those results once they are available.  If we need to provide any medications or if I recommend any supplements they will be noted in your result notes and phone call.  For now I recommend making sure that you stay well-hydrated, gentle stretches and massage to the area as well as alternating Tylenol and ibuprofen as needed for pain. If your pain becomes more severe, you start having difficulty walking, numbness, tingling, weakness in the legs please go to the emergency room as these could be signs of medical emergency.

## 2023-09-14 LAB — CBC WITH DIFFERENTIAL/PLATELET
Basophils Absolute: 0 10*3/uL (ref 0.0–0.2)
Basos: 1 %
EOS (ABSOLUTE): 0 10*3/uL (ref 0.0–0.4)
Eos: 0 %
Hematocrit: 44 % (ref 37.5–51.0)
Hemoglobin: 14.9 g/dL (ref 13.0–17.7)
Immature Grans (Abs): 0 10*3/uL (ref 0.0–0.1)
Immature Granulocytes: 2 %
Lymphocytes Absolute: 1 10*3/uL (ref 0.7–3.1)
Lymphs: 40 %
MCH: 28.3 pg (ref 26.6–33.0)
MCHC: 33.9 g/dL (ref 31.5–35.7)
MCV: 84 fL (ref 79–97)
Monocytes Absolute: 0.5 10*3/uL (ref 0.1–0.9)
Monocytes: 22 %
Neutrophils Absolute: 0.8 10*3/uL — ABNORMAL LOW (ref 1.4–7.0)
Neutrophils: 35 %
Platelets: 139 10*3/uL — ABNORMAL LOW (ref 150–450)
RBC: 5.27 x10E6/uL (ref 4.14–5.80)
RDW: 13.1 % (ref 11.6–15.4)
WBC: 2.4 10*3/uL — CL (ref 3.4–10.8)

## 2023-09-14 LAB — COMPREHENSIVE METABOLIC PANEL WITH GFR
ALT: 25 IU/L (ref 0–44)
AST: 152 IU/L — ABNORMAL HIGH (ref 0–40)
Albumin: 3.9 g/dL — ABNORMAL LOW (ref 4.1–5.1)
Alkaline Phosphatase: 76 IU/L (ref 44–121)
BUN/Creatinine Ratio: 12 (ref 9–20)
BUN: 13 mg/dL (ref 6–20)
Bilirubin Total: <0.2 mg/dL (ref 0.0–1.2)
CO2: 21 mmol/L (ref 20–29)
Calcium: 7.2 mg/dL — ABNORMAL LOW (ref 8.7–10.2)
Chloride: 97 mmol/L (ref 96–106)
Creatinine, Ser: 1.1 mg/dL (ref 0.76–1.27)
Globulin, Total: 2.4 g/dL (ref 1.5–4.5)
Glucose: 82 mg/dL (ref 70–99)
Potassium: 3.7 mmol/L (ref 3.5–5.2)
Sodium: 134 mmol/L (ref 134–144)
Total Protein: 6.3 g/dL (ref 6.0–8.5)
eGFR: 90 mL/min/{1.73_m2} (ref >59)

## 2023-09-14 LAB — VITAMIN B12: Vitamin B-12: 1305 pg/mL — ABNORMAL HIGH (ref 232–1245)

## 2023-09-19 ENCOUNTER — Ambulatory Visit: Payer: 59 | Admitting: Family Medicine

## 2024-03-24 ENCOUNTER — Emergency Department (HOSPITAL_COMMUNITY)
Admission: EM | Admit: 2024-03-24 | Discharge: 2024-03-24 | Disposition: A | Discharge status: Home / Self Care | Payer: Self-pay | Attending: Emergency Medicine | Admitting: Emergency Medicine

## 2024-03-24 ENCOUNTER — Encounter (HOSPITAL_COMMUNITY): Payer: Self-pay | Admitting: Emergency Medicine

## 2024-03-24 ENCOUNTER — Emergency Department (HOSPITAL_COMMUNITY): Payer: Self-pay

## 2024-03-24 ENCOUNTER — Other Ambulatory Visit: Payer: Self-pay

## 2024-03-24 DIAGNOSIS — R0789 Other chest pain: Secondary | ICD-10-CM | POA: Insufficient documentation

## 2024-03-24 LAB — CBC WITH DIFFERENTIAL/PLATELET
Abs Immature Granulocytes: 0.02 K/uL (ref 0.00–0.07)
Basophils Absolute: 0 K/uL (ref 0.0–0.1)
Basophils Relative: 0 %
Eosinophils Absolute: 0.2 K/uL (ref 0.0–0.5)
Eosinophils Relative: 3 %
HCT: 45.6 % (ref 39.0–52.0)
Hemoglobin: 14.7 g/dL (ref 13.0–17.0)
Immature Granulocytes: 0 %
Lymphocytes Relative: 19 %
Lymphs Abs: 1 K/uL (ref 0.7–4.0)
MCH: 27.5 pg (ref 26.0–34.0)
MCHC: 32.2 g/dL (ref 30.0–36.0)
MCV: 85.2 fL (ref 80.0–100.0)
Monocytes Absolute: 0.7 K/uL (ref 0.1–1.0)
Monocytes Relative: 13 %
Neutro Abs: 3.6 K/uL (ref 1.7–7.7)
Neutrophils Relative %: 65 %
Platelets: 193 K/uL (ref 150–400)
RBC: 5.35 MIL/uL (ref 4.22–5.81)
RDW: 12.2 % (ref 11.5–15.5)
WBC: 5.5 K/uL (ref 4.0–10.5)
nRBC: 0 % (ref 0.0–0.2)

## 2024-03-24 LAB — COMPREHENSIVE METABOLIC PANEL WITH GFR
ALT: 9 U/L (ref 0–44)
AST: 29 U/L (ref 15–41)
Albumin: 4.4 g/dL (ref 3.5–5.0)
Alkaline Phosphatase: 95 U/L (ref 38–126)
Anion gap: 9 (ref 5–15)
BUN: 20 mg/dL (ref 6–20)
CO2: 25 mmol/L (ref 22–32)
Calcium: 9.4 mg/dL (ref 8.9–10.3)
Chloride: 103 mmol/L (ref 98–111)
Creatinine, Ser: 1.17 mg/dL (ref 0.61–1.24)
GFR, Estimated: >60 mL/min (ref >60)
Glucose, Bld: 86 mg/dL (ref 70–99)
Potassium: 4.1 mmol/L (ref 3.5–5.1)
Sodium: 137 mmol/L (ref 135–145)
Total Bilirubin: 0.4 mg/dL (ref 0.0–1.2)
Total Protein: 7.9 g/dL (ref 6.5–8.1)

## 2024-03-24 LAB — URINALYSIS, ROUTINE W REFLEX MICROSCOPIC
Bilirubin Urine: NEGATIVE
Glucose, UA: NEGATIVE mg/dL
Hgb urine dipstick: NEGATIVE
Ketones, ur: NEGATIVE mg/dL
Leukocytes,Ua: NEGATIVE
Nitrite: NEGATIVE
Protein, ur: NEGATIVE mg/dL
Specific Gravity, Urine: 1.027 (ref 1.005–1.030)
pH: 5 (ref 5.0–8.0)

## 2024-03-24 LAB — LIPASE, BLOOD: Lipase: 34 U/L (ref 11–51)

## 2024-03-24 LAB — TROPONIN T, HIGH SENSITIVITY: Troponin T High Sensitivity: <15 ng/L (ref 0–19)

## 2024-03-24 NOTE — Discharge Instructions (Signed)
 As discussed, your evaluation today has been largely reassuring.  But, it is important that you monitor your condition carefully, and do not hesitate to return to the ED if you develop new, or concerning changes in your condition. ? ?Otherwise, please follow-up with your physician for appropriate ongoing care. ? ?

## 2024-03-24 NOTE — ED Triage Notes (Signed)
 Pt reports right sided rib pain that started after eating this morning. Denies injury to the area. Denies n/v.

## 2024-03-24 NOTE — ED Provider Notes (Signed)
 [place] EMERGENCY DEPARTMENT AT [facility] Provider Note   CSN: [id number] Arrival date & time: 03/24/24  1758     Patient presents with: Chest Pain   Richard Blevins is a 33 y.o. male.   HPI Patient presents with right sided pain. Currently he is asymptomatic.  He was well prior to today.  Twice today, he had pain in the right anterior inferior right axilla.  Each episode was after eating, resolved, and again currently he has no pain.  There was no associated dyspnea, no left-sided pain, no syncope.     Prior to Admission medications   Medication Sig Start Date End Date Taking? Authorizing Provider  doxycycline  (VIBRAMYCIN ) 100 MG capsule Take 1 capsule (100 mg total) by mouth 2 (two) times daily. 06/05/20   Doretha Folks, MD  loperamide  (IMODIUM  A-D) 2 MG tablet Take 1 tablet (2 mg total) 4 (four) times daily as needed by mouth for diarrhea or loose stools. 04/11/17   Pasco [place], MD    Allergies: Patient has no known allergies.    Review of Systems  Updated Vital Signs BP 114/76   Pulse 74   Temp 98.1 F (36.7 C)   Resp 20   SpO2 100%   Physical Exam Vitals and nursing note reviewed.  Constitutional:      General: He is not in acute distress.    Appearance: He is well-developed.  HENT:     Head: Normocephalic and atraumatic.  Eyes:     Conjunctiva/sclera: Conjunctivae normal.  Cardiovascular:     Rate and Rhythm: Normal rate and regular rhythm.  Pulmonary:     Effort: Pulmonary effort is normal. No respiratory distress.     Breath sounds: No stridor.  Abdominal:     General: There is no distension.  Skin:    General: Skin is warm and dry.     Comments: No rash or skin changes  Neurological:     Mental Status: He is alert and oriented to person, place, and time.     (all labs ordered are listed, but only abnormal results are displayed) Labs Reviewed  COMPREHENSIVE METABOLIC PANEL WITH GFR  LIPASE, BLOOD  CBC WITH  DIFFERENTIAL/PLATELET  URINALYSIS, ROUTINE W REFLEX MICROSCOPIC  TROPONIN T, HIGH SENSITIVITY    EKG: EKG Interpretation Date/Time:  Saturday March 24 2024 21:32:14 EDT Ventricular Rate:  72 PR Interval:  137 QRS Duration:  63 QT Interval:  366 QTC Calculation: 401 R Axis:   82  Text Interpretation: Sinus rhythm Probable left atrial enlargement Left ventricular hypertrophy ST-t wave abnormality Artifact Abnormal ECG Confirmed by Garrick Charleston [phone]) on 03/24/2024 9:46:44 PM  Radiology: DG Chest 2 View Result Date: 03/24/2024 EXAM: 2 VIEW(S) XRAY OF THE CHEST 03/24/2024 06:19:00 PM COMPARISON: None available. CLINICAL HISTORY: Rib pain. Pt reports right sided rib pain that started after eating this morning. FINDINGS: LUNGS AND PLEURA: No focal pulmonary opacity. No pulmonary edema. No pleural effusion. No pneumothorax. HEART AND MEDIASTINUM: No acute abnormality of the cardiac and mediastinal silhouettes. BONES AND SOFT TISSUES: No acute osseous abnormality. IMPRESSION: 1. No acute process. Electronically signed by: Greig Pique MD 03/24/2024 06:38 PM EDT RP Workstation: [id number]     Procedures   Medications Ordered in the ED - No data to display                                  Medical Decision Making Generally  well-appearing adult male presents with right-sided episodic chest pain.  Location of pain suggestive of hepatobiliary versus thoracic pathology.  Patient has no left-sided pain, currently no pain at all, reassuring for low suspicion of ACS, though this is a possibility. Spontaneous pneumothorax additional consideration. The patient has no dyspnea, no current chest pain. Pulse ox 100% room air normal cardiac 75 sinus normal  Amount and/or Complexity of Data Reviewed Labs: ordered. Decision-making details documented in ED Course. Radiology: ordered and independent interpretation performed. Decision-making details documented in ED Course. ECG/medicine tests:  ordered and independent interpretation performed. Decision-making details documented in ED Course.  Risk Decision regarding hospitalization.   10:35 PM Patient calm, in no distress.  We discussed all findings, and have reviewed his labs.  Labs unremarkable with no evidence for ACS, no hepatobiliary dysfunction, no urinary tract infection, nor hematuria suggesting kidney stone.  Patient describes some mild discomfort in the axial, no abdominal pain, no nausea. We discussed possibilities for his pain including musculoskeletal versus early presentation and I specifically discussed return precautions for consideration of GI associated symptoms possibly necessitating ultrasound.  However, with no abdominal pain, no fever, unremarkable vital signs, reassuring labs, x-ray, patient discharged, and he is comfortable with this plan.      Final diagnoses:  Atypical chest pain    ED Discharge Orders     None          Garrick Charleston, MD 03/24/24 2235
